# Patient Record
Sex: Male | Born: 1995 | Hispanic: No | Marital: Single | Smoking: Never smoker
Health system: Southern US, Community
[De-identification: ages and names within clinical notes are randomized; demographics above are authoritative.]

## PROBLEM LIST (undated history)

## (undated) DIAGNOSIS — T753XXA Motion sickness, initial encounter: Secondary | ICD-10-CM

## (undated) DIAGNOSIS — J302 Other seasonal allergic rhinitis: Secondary | ICD-10-CM

## (undated) DIAGNOSIS — J45990 Exercise induced bronchospasm: Secondary | ICD-10-CM

## (undated) HISTORY — DX: Exercise induced bronchospasm: J45.990

---
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
## 2018-04-20 ENCOUNTER — Encounter: Payer: Self-pay | Admitting: *Deleted

## 2018-04-20 ENCOUNTER — Telehealth: Payer: Self-pay | Admitting: *Deleted

## 2018-04-20 ENCOUNTER — Ambulatory Visit: Payer: Medicaid Other | Admitting: Neurology

## 2018-04-20 NOTE — Telephone Encounter (Signed)
Pt no showed new pt appt on 04/20/2018 @ 07:30.

## 2018-04-21 ENCOUNTER — Encounter: Payer: Self-pay | Admitting: Neurology

## 2018-05-27 ENCOUNTER — Other Ambulatory Visit (HOSPITAL_COMMUNITY): Payer: Self-pay | Admitting: Internal Medicine

## 2018-05-27 DIAGNOSIS — R112 Nausea with vomiting, unspecified: Secondary | ICD-10-CM

## 2018-05-27 DIAGNOSIS — K828 Other specified diseases of gallbladder: Secondary | ICD-10-CM

## 2018-06-01 ENCOUNTER — Ambulatory Visit (HOSPITAL_COMMUNITY)
Admission: RE | Admit: 2018-06-01 | Discharge: 2018-06-01 | Disposition: A | Discharge status: Home / Self Care | Payer: Medicaid Other | Source: Ambulatory Visit | Attending: Internal Medicine | Admitting: Internal Medicine

## 2018-06-01 DIAGNOSIS — R112 Nausea with vomiting, unspecified: Secondary | ICD-10-CM

## 2018-06-01 MED ORDER — TECHNETIUM TC 99M SULFUR COLLOID
2.0000 | Freq: Once | INTRAVENOUS | Status: AC | PRN
  Administered 2018-06-01: 2 via ORAL

## 2018-06-05 ENCOUNTER — Ambulatory Visit (HOSPITAL_COMMUNITY)
Admission: RE | Admit: 2018-06-05 | Discharge: 2018-06-05 | Disposition: A | Discharge status: Home / Self Care | Payer: Medicaid Other | Source: Ambulatory Visit | Attending: Internal Medicine | Admitting: Internal Medicine

## 2018-06-05 DIAGNOSIS — K828 Other specified diseases of gallbladder: Secondary | ICD-10-CM | POA: Diagnosis present

## 2018-06-05 MED ORDER — TECHNETIUM TC 99M MEBROFENIN IV KIT
4.7300 | PACK | Freq: Once | INTRAVENOUS | Status: AC | PRN
  Administered 2018-06-05: 4.73 via INTRAVENOUS

## 2019-02-07 IMAGING — NM NM GASTRIC EMPTYING
2 series · 2 of 2 positions shown · non-contrast
Comparison: None.

CLINICAL DATA: 22-year-old male with nausea and vomiting for 2
months. Initial encounter.

EXAM:
NUCLEAR MEDICINE GASTRIC EMPTYING SCAN
TECHNIQUE: After oral ingestion of radiolabeled meal, sequential abdominal
images were obtained for 60 minutes. Residual percentage of activity
remaining within the stomach was calculated at 60 minutes.
RADIOPHARMACEUTICALS:  2.1 mCi 8c-WWm sulfur colloid in standardized
meal

[Series 1: 0 min · 4.14mm/px · 1 of 1 slices shown]
[im 1/1]
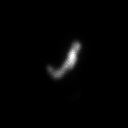

[Series 2: 1 hr · 4.14mm/px · 1 of 1 slices shown]
[im 1/1]
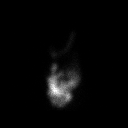

[2 of 2 positions shown; findings below may reference images not displayed]

FINDINGS: Expected location of the stomach in the left upper quadrant.
Ingested meal empties the stomach gradually over the course of the
study with 7.7% retention at 60 min.
IMPRESSION: Normal gastric emptying study.
